# Patient Record
Sex: Female | Born: 1955 | Race: White | Hispanic: No | Smoking: Never smoker
Health system: Southern US, Community
[De-identification: ages and names within clinical notes are randomized; demographics above are authoritative.]

## PROBLEM LIST (undated history)

## (undated) DIAGNOSIS — I10 Essential (primary) hypertension: Secondary | ICD-10-CM

## (undated) HISTORY — PX: BACK SURGERY: SHX140

## (undated) HISTORY — PX: EYE SURGERY: SHX253

## (undated) HISTORY — PX: COLONOSCOPY: SHX174

---

## 2008-09-24 ENCOUNTER — Ambulatory Visit: Payer: Self-pay | Admitting: Orthopedic Surgery

## 2014-03-21 ENCOUNTER — Ambulatory Visit: Payer: Self-pay | Admitting: Gastroenterology

## 2015-04-21 ENCOUNTER — Encounter: Payer: Self-pay | Admitting: Emergency Medicine

## 2015-04-21 ENCOUNTER — Emergency Department: Payer: No Typology Code available for payment source

## 2015-04-21 ENCOUNTER — Emergency Department
Admission: EM | Admit: 2015-04-21 | Discharge: 2015-04-21 | Disposition: A | Discharge status: Home / Self Care | Payer: No Typology Code available for payment source | Attending: Emergency Medicine | Admitting: Emergency Medicine

## 2015-04-21 DIAGNOSIS — S3992XA Unspecified injury of lower back, initial encounter: Secondary | ICD-10-CM | POA: Insufficient documentation

## 2015-04-21 DIAGNOSIS — Y998 Other external cause status: Secondary | ICD-10-CM | POA: Diagnosis not present

## 2015-04-21 DIAGNOSIS — Y9289 Other specified places as the place of occurrence of the external cause: Secondary | ICD-10-CM | POA: Insufficient documentation

## 2015-04-21 DIAGNOSIS — S20212A Contusion of left front wall of thorax, initial encounter: Secondary | ICD-10-CM | POA: Diagnosis not present

## 2015-04-21 DIAGNOSIS — S199XXA Unspecified injury of neck, initial encounter: Secondary | ICD-10-CM | POA: Diagnosis present

## 2015-04-21 DIAGNOSIS — Y9389 Activity, other specified: Secondary | ICD-10-CM | POA: Insufficient documentation

## 2015-04-21 DIAGNOSIS — Y92410 Unspecified street and highway as the place of occurrence of the external cause: Secondary | ICD-10-CM | POA: Insufficient documentation

## 2015-04-21 DIAGNOSIS — S161XXA Strain of muscle, fascia and tendon at neck level, initial encounter: Secondary | ICD-10-CM | POA: Diagnosis not present

## 2015-04-21 DIAGNOSIS — I10 Essential (primary) hypertension: Secondary | ICD-10-CM | POA: Insufficient documentation

## 2015-04-21 DIAGNOSIS — M549 Dorsalgia, unspecified: Secondary | ICD-10-CM

## 2015-04-21 HISTORY — DX: Essential (primary) hypertension: I10

## 2015-04-21 MED ORDER — HYDROCODONE-ACETAMINOPHEN 5-325 MG PO TABS
1.0000 | ORAL_TABLET | Freq: Once | ORAL | Status: AC
  Administered 2015-04-21: 1 via ORAL
  Filled 2015-04-21: qty 1

## 2015-04-21 MED ORDER — IBUPROFEN 600 MG PO TABS: 600.0000 mg | ORAL_TABLET | Freq: Once | ORAL | Status: AC

## 2015-04-21 MED ORDER — HYDROCODONE-ACETAMINOPHEN 5-325 MG PO TABS: 1.0000 | ORAL_TABLET | Freq: Once | ORAL | Status: AC

## 2015-04-21 MED ORDER — IBUPROFEN 600 MG PO TABS
600.0000 mg | ORAL_TABLET | Freq: Once | ORAL | Status: AC
  Administered 2015-04-21: 600 mg via ORAL
  Filled 2015-04-21: qty 1
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
## 2019-09-06 ENCOUNTER — Ambulatory Visit: Payer: BC Managed Care – PPO | Attending: Internal Medicine

## 2019-09-06 DIAGNOSIS — Z20822 Contact with and (suspected) exposure to covid-19: Secondary | ICD-10-CM

## 2019-09-07 LAB — NOVEL CORONAVIRUS, NAA: SARS-CoV-2, NAA: NOT DETECTED

## 2019-11-20 ENCOUNTER — Other Ambulatory Visit: Payer: Self-pay

## 2019-11-20 ENCOUNTER — Telehealth: Payer: Self-pay

## 2019-11-20 DIAGNOSIS — Z86010 Personal history of colonic polyps: Secondary | ICD-10-CM

## 2019-11-20 NOTE — Telephone Encounter (Signed)
Gastroenterology Pre-Procedure Review  Request Date: Tuesday 12/11/19 Requesting Physician: Dr. Vicente Males  PATIENT REVIEW QUESTIONS: The patient responded to the following health history questions as indicated:    1. Are you having any GI issues? no 2. Do you have a personal history of Polyps? no 3. Do you have a family history of Colon Cancer or Polyps? no 4. Diabetes Mellitus? no 5. Joint replacements in the past 12 months?no 6. Major health problems in the past 3 months?no 7. Any artificial heart valves, MVP, or defibrillator?no    MEDICATIONS & ALLERGIES:    Patient reports the following regarding taking any anticoagulation/antiplatelet therapy:   Plavix, Coumadin, Eliquis, Xarelto, Lovenox, Pradaxa, Brilinta, or Effient? no Aspirin? no  Patient confirms/reports the following medications:  Current Outpatient Medications  Medication Sig Dispense Refill  . HYDROcodone-acetaminophen (NORCO/VICODIN) 5-325 MG per tablet Take 1 tablet by mouth once. 30 tablet 0  . ibuprofen (ADVIL,MOTRIN) 600 MG tablet Take 1 tablet (600 mg total) by mouth once. 30 tablet 0   No current facility-administered medications for this visit.    Patient confirms/reports the following allergies:  No Known Allergies  No orders of the defined types were placed in this encounter.   AUTHORIZATION INFORMATION Primary Insurance: 1D#: Group #:  Secondary Insurance: 1D#: Group #:  SCHEDULE INFORMATION: Date: Tuesday 12/11/19 Time: Location:[facility]

## 2019-11-30 ENCOUNTER — Ambulatory Visit: Payer: BC Managed Care – PPO | Attending: Internal Medicine

## 2019-11-30 DIAGNOSIS — Z23 Encounter for immunization: Secondary | ICD-10-CM

## 2019-11-30 NOTE — Progress Notes (Signed)
   Covid-19 Vaccination Clinic  Name:  Brooke Diaz    MRN: [id number] DOB: August 21, 1956  11/30/2019  Brooke Diaz was observed post Covid-19 immunization for 15 minutes without incident. She was provided with Vaccine Information Sheet and instruction to access the V-Safe system.   Brooke Diaz was instructed to call 911 with any severe reactions post vaccine: [name] Kitchen Difficulty breathing  . Swelling of face and throat  . A fast heartbeat  . A bad rash all over body  . Dizziness and weakness   Immunizations Administered    Name Date Dose VIS Date Route   Pfizer COVID-19 Vaccine 11/30/2019 10:07 AM 0.3 mL 08/24/2019 Intramuscular   Manufacturer: [place]   Lot: [id number]   [place]: [id number]

## 2019-12-07 ENCOUNTER — Other Ambulatory Visit
Admission: RE | Admit: 2019-12-07 | Discharge: 2019-12-07 | Disposition: A | Discharge status: Home / Self Care | Payer: BC Managed Care – PPO | Source: Ambulatory Visit | Attending: Gastroenterology | Admitting: Gastroenterology

## 2019-12-07 ENCOUNTER — Other Ambulatory Visit: Payer: Self-pay

## 2019-12-07 DIAGNOSIS — Z01812 Encounter for preprocedural laboratory examination: Secondary | ICD-10-CM | POA: Diagnosis present

## 2019-12-07 DIAGNOSIS — Z20822 Contact with and (suspected) exposure to covid-19: Secondary | ICD-10-CM | POA: Insufficient documentation

## 2019-12-07 LAB — SARS CORONAVIRUS 2 (TAT 6-24 HRS): SARS Coronavirus 2: NEGATIVE

## 2019-12-10 ENCOUNTER — Encounter: Payer: Self-pay | Admitting: Gastroenterology

## 2019-12-11 ENCOUNTER — Ambulatory Visit
Admission: RE | Admit: 2019-12-11 | Discharge: 2019-12-11 | Disposition: A | Discharge status: Home / Self Care | Payer: BC Managed Care – PPO | Attending: Gastroenterology | Admitting: Gastroenterology

## 2019-12-11 ENCOUNTER — Ambulatory Visit: Payer: BC Managed Care – PPO | Admitting: Certified Registered"

## 2019-12-11 ENCOUNTER — Encounter: Payer: Self-pay | Admitting: Gastroenterology

## 2019-12-11 ENCOUNTER — Other Ambulatory Visit: Payer: Self-pay

## 2019-12-11 ENCOUNTER — Encounter
Admission: RE | Disposition: A | Discharge status: Home / Self Care | Payer: Self-pay | Source: Home / Self Care | Attending: Gastroenterology

## 2019-12-11 DIAGNOSIS — Z7982 Long term (current) use of aspirin: Secondary | ICD-10-CM | POA: Diagnosis not present

## 2019-12-11 DIAGNOSIS — K635 Polyp of colon: Secondary | ICD-10-CM

## 2019-12-11 DIAGNOSIS — Z791 Long term (current) use of non-steroidal anti-inflammatories (NSAID): Secondary | ICD-10-CM | POA: Insufficient documentation

## 2019-12-11 DIAGNOSIS — Z1211 Encounter for screening for malignant neoplasm of colon: Secondary | ICD-10-CM | POA: Diagnosis present

## 2019-12-11 DIAGNOSIS — D124 Benign neoplasm of descending colon: Secondary | ICD-10-CM | POA: Diagnosis not present

## 2019-12-11 DIAGNOSIS — Z860100 Personal history of colon polyps, unspecified: Secondary | ICD-10-CM

## 2019-12-11 DIAGNOSIS — K640 First degree hemorrhoids: Secondary | ICD-10-CM | POA: Diagnosis not present

## 2019-12-11 DIAGNOSIS — I10 Essential (primary) hypertension: Secondary | ICD-10-CM | POA: Diagnosis not present

## 2019-12-11 DIAGNOSIS — Z79899 Other long term (current) drug therapy: Secondary | ICD-10-CM | POA: Diagnosis not present

## 2019-12-11 HISTORY — PX: COLONOSCOPY WITH PROPOFOL: SHX5780

## 2019-12-11 SURGERY — COLONOSCOPY WITH PROPOFOL
Anesthesia: General

## 2019-12-11 MED ORDER — SODIUM CHLORIDE 0.9 % IV SOLN: INTRAVENOUS | Status: DC

## 2019-12-11 MED ORDER — LIDOCAINE HCL (CARDIAC) PF 100 MG/5ML IV SOSY
PREFILLED_SYRINGE | INTRAVENOUS | Status: DC | PRN
  Administered 2019-12-11: 50 mg via INTRAVENOUS

## 2019-12-11 MED ORDER — PROPOFOL 500 MG/50ML IV EMUL
INTRAVENOUS | Status: DC | PRN
  Administered 2019-12-11: 100 ug/kg/min via INTRAVENOUS

## 2019-12-11 MED ORDER — SODIUM CHLORIDE 0.9 % IV SOLN: INTRAVENOUS | Status: DC | PRN

## 2019-12-11 MED ORDER — PROPOFOL 500 MG/50ML IV EMUL
INTRAVENOUS | Status: AC
  Filled 2019-12-11: qty 50

## 2019-12-11 MED ORDER — PROPOFOL 10 MG/ML IV BOLUS
INTRAVENOUS | Status: DC | PRN
  Administered 2019-12-11: 50 mg via INTRAVENOUS
  Administered 2019-12-11: 20 mg via INTRAVENOUS

## 2019-12-11 NOTE — Anesthesia Preprocedure Evaluation (Signed)
Anesthesia Evaluation  Patient identified by MRN, date of birth, ID band Patient awake    Reviewed: Allergy & Precautions, H&P , NPO status , Patient's Chart, lab work & pertinent test results  Airway Mallampati: II  TM Distance: >3 FB     Dental  (+) Teeth Intact   Pulmonary neg pulmonary ROS,    breath sounds clear to auscultation       Cardiovascular hypertension,  Rhythm:regular Rate:Normal     Neuro/Psych negative neurological ROS  negative psych ROS   GI/Hepatic negative GI ROS, Neg liver ROS,   Endo/Other  negative endocrine ROS  Renal/GU negative Renal ROS  negative genitourinary   Musculoskeletal   Abdominal   Peds  Hematology negative hematology ROS (+)   Anesthesia Other Findings Past Medical History: No date: Hypertension  Past Surgical History: No date: BACK SURGERY No date: COLONOSCOPY No date: EYE SURGERY  BMI    Body Mass Index: 19.75 kg/m      Reproductive/Obstetrics negative OB ROS                             Anesthesia Physical Anesthesia Plan  ASA: II  Anesthesia Plan: General   Post-op Pain Management:    Induction:   PONV Risk Score and Plan: Propofol infusion and TIVA  Airway Management Planned: Natural Airway and Nasal Cannula  Additional Equipment:   Intra-op Plan:   Post-operative Plan:   Informed Consent: I have reviewed the patients History and Physical, chart, labs and discussed the procedure including the risks, benefits and alternatives for the proposed anesthesia with the patient or authorized representative who has indicated his/her understanding and acceptance.     Dental Advisory Given  Plan Discussed with: Anesthesiologist, CRNA and Surgeon  Anesthesia Plan Comments:         Anesthesia Quick Evaluation

## 2019-12-11 NOTE — Anesthesia Postprocedure Evaluation (Signed)
Anesthesia Post Note  Patient: Brooke Diaz  Procedure(s) Performed: COLONOSCOPY WITH PROPOFOL (N/A )  Patient location during evaluation: PACU Anesthesia Type: General Level of consciousness: awake and alert Pain management: pain level controlled Vital Signs Assessment: post-procedure vital signs reviewed and stable Respiratory status: spontaneous breathing, nonlabored ventilation and respiratory function stable Cardiovascular status: blood pressure returned to baseline and stable Postop Assessment: no apparent nausea or vomiting Anesthetic complications: no     Last Vitals:  Vitals:   12/11/19 1104 12/11/19 1114  BP: 113/72 (!) 120/53  Pulse: 62   Resp: 12   Temp: 36.7 C   SpO2: 100%     Last Pain:  Vitals:   12/11/19 1124  TempSrc:   PainSc: 0-No pain                 Tera Mater

## 2019-12-11 NOTE — H&P (Signed)
Brooke Lame, MD [address] [place], [place] [address] Phone:[phone] Fax : [phone]  Primary Care Physician:  Brooke Billet, MD Primary Gastroenterologist:  Dr. Allen Diaz  Pre-Procedure History & Physical: HPI:  Brooke Diaz is a 64 y.o. female is here for an colonoscopy.   Past Medical History:  Diagnosis Date  . Hypertension     Past Surgical History:  Procedure Laterality Date  . BACK SURGERY    . COLONOSCOPY    . EYE SURGERY      Prior to Admission medications   Medication Sig Start Date End Date Taking? Authorizing Provider  aspirin 325 MG EC tablet Take 325 mg by mouth daily.   Yes [provider]  lisinopril (ZESTRIL) 20 MG tablet Take 20 mg by mouth daily.   Yes [provider]  simvastatin (ZOCOR) 10 MG tablet Take 10 mg by mouth daily.   Yes [provider]  HYDROcodone-acetaminophen (NORCO/VICODIN) 5-325 MG per tablet Take 1 tablet by mouth once. Patient not taking: Reported on 12/11/2019 04/21/15   Brooke Hai, PA-C  ibuprofen (ADVIL,MOTRIN) 600 MG tablet Take 1 tablet (600 mg total) by mouth once. Patient not taking: Reported on 12/11/2019 04/21/15   Brooke Hai, PA-C    Allergies as of 11/20/2019  . (No Known Allergies)    History reviewed. No pertinent family history.  Social History   Socioeconomic History  . Marital status: Divorced    Spouse name: Not on file  . Number of children: Not on file  . Years of education: Not on file  . Highest education level: Not on file  Occupational History  . Not on file  Tobacco Use  . Smoking status: Never Smoker  . Smokeless tobacco: Never Used  Substance and Sexual Activity  . Alcohol use: Yes    Alcohol/week: 4.0 standard drinks    Types: 4 Glasses of wine per week  . Drug use: Never  . Sexual activity: Not on file  Other Topics Concern  . Not on file  Social History Narrative  . Not on file   Social Determinants of Health   Financial  Resource Strain:   . Difficulty of Paying Living Expenses:   Food Insecurity:   . Worried About Charity fundraiser in the Last Year:   . Arboriculturist in the Last Year:   Transportation Needs:   . Film/video editor (Medical):   [name] Kitchen Lack of Transportation (Non-Medical):   Physical Activity:   . Days of Exercise per Week:   . Minutes of Exercise per Session:   Stress:   . Feeling of Stress :   Social Connections:   . Frequency of Communication with Friends and Family:   . Frequency of Social Gatherings with Friends and Family:   . Attends Religious Services:   . Active Member of Clubs or Organizations:   . Attends Archivist Meetings:   [name] Kitchen Marital Status:   Intimate Partner Violence:   . Fear of Current or Ex-Partner:   . Emotionally Abused:   [name] Kitchen Physically Abused:   . Sexually Abused:     Review of Systems: See HPI, otherwise negative ROS  Physical Exam: BP (!) 155/70   Pulse 64   Temp (!) 97.5 F (36.4 C) (Temporal)   Resp 16   Ht 5\' 2"  (1.575 m)   Wt 49 kg   SpO2 100%   BMI 19.75 kg/m  General:   Alert,  pleasant and cooperative in NAD Head:  Normocephalic and atraumatic. Neck:  Supple; no masses or thyromegaly. Lungs:  Clear throughout to auscultation.    Heart:  Regular rate and rhythm. Abdomen:  Soft, nontender and nondistended. Normal bowel sounds, without guarding, and without rebound.   Neurologic:  Alert and  oriented x4;  grossly normal neurologically.  Impression/Plan: Brooke Diaz is here for an colonoscopy to be performed for history of colon polyps that were adenomatous in 03/2014  Risks, benefits, limitations, and alternatives regarding  colonoscopy have been reviewed with the patient.  Questions have been answered.  All parties agreeable.   Brooke Lame, MD  12/11/2019, 10:19 AM

## 2019-12-11 NOTE — Transfer of Care (Signed)
Immediate Anesthesia Transfer of Care Note  Patient: Brooke Diaz  Procedure(s) Performed: COLONOSCOPY WITH PROPOFOL (N/A )  Patient Location: Endoscopy Unit  Anesthesia Type:General  Level of Consciousness: awake, alert  and oriented  Airway & Oxygen Therapy: Patient Spontanous Breathing  Post-op Assessment: Report given to RN and Post -op Vital signs reviewed and stable  Post vital signs: Reviewed and stable  Last Vitals:  Vitals Value Taken Time  BP 113/72 12/11/19 1106  Temp 36.7 C 12/11/19 1104  Pulse 63 12/11/19 1106  Resp 15 12/11/19 1106  SpO2 98 % 12/11/19 1106  Vitals shown include unvalidated device data.  Last Pain:  Vitals:   12/11/19 1104  TempSrc: Temporal  PainSc: 0-No pain         Complications: No apparent anesthesia complications

## 2019-12-11 NOTE — Op Note (Signed)
[facility] Gastroenterology Patient Name: Brooke Diaz Procedure Date: 12/11/2019 10:21 AM MRN: [id number] Account #: [id number] Date of Birth: [birth year] Admit Type: Outpatient Age: 64 Room: [facility] ENDO ROOM 4 Gender: Female Note Status: Finalized Procedure:             Colonoscopy Indications:           High risk colon cancer surveillance: Personal history                         of colonic polyps Providers:             Lucilla Lame MD, MD Medicines:             Propofol per Anesthesia Complications:         No immediate complications. Procedure:             Pre-Anesthesia Assessment:                        - Prior to the procedure, a History and Physical was                         performed, and patient medications and allergies were                         reviewed. The patient's tolerance of previous                         anesthesia was also reviewed. The risks and benefits                         of the procedure and the sedation options and risks                         were discussed with the patient. All questions were                         answered, and informed consent was obtained. Prior                         Anticoagulants: The patient has taken no previous                         anticoagulant or antiplatelet agents. ASA Grade                         Assessment: II - A patient with mild systemic disease.                         After reviewing the risks and benefits, the patient                         was deemed in satisfactory condition to undergo the                         procedure.                        After obtaining informed consent, the colonoscope was  passed under direct vision. Throughout the procedure,                         the patient's blood pressure, pulse, and oxygen                         saturations were monitored continuously. The                         Colonoscope was introduced through the anus  and                         advanced to the the cecum, identified by appendiceal                         orifice and ileocecal valve. The colonoscopy was                         performed without difficulty. The patient tolerated                         the procedure well. The quality of the bowel                         preparation was excellent. Findings:      The perianal and digital rectal examinations were normal.      A 4 mm polyp was found in the descending colon. The polyp was sessile.       The polyp was removed with a cold snare. Resection and retrieval were       complete.      Non-bleeding internal hemorrhoids were found during retroflexion. The       hemorrhoids were Grade I (internal hemorrhoids that do not prolapse). Impression:            - One 4 mm polyp in the descending colon, removed with                         a cold snare. Resected and retrieved.                        - Non-bleeding internal hemorrhoids. Recommendation:        - Discharge patient to home.                        - Resume previous diet.                        - Continue present medications.                        - Await pathology results.                        - Repeat colonoscopy in 5 years for surveillance. Procedure Code(s):     --- Professional ---                        [phone], Colonoscopy, flexible; with removal of  tumor(s), polyp(s), or other lesion(s) by snare                         technique Diagnosis Code(s):     --- Professional ---                        Z86.010, Personal history of colonic polyps                        K63.5, Polyp of colon CPT copyright 2019 American Medical Association. All rights reserved. The codes documented in this report are preliminary and upon coder review may  be revised to meet current compliance requirements. Lucilla Lame MD, MD 12/11/2019 11:01:40 AM This report has been signed electronically. Number of Addenda: 0 Note  Initiated On: 12/11/2019 10:21 AM Scope Withdrawal Time: 0 hours 10 minutes 51 seconds  Total Procedure Duration: 0 hours 21 minutes 0 seconds  Estimated Blood Loss:  Estimated blood loss: none.      [facility]

## 2019-12-12 ENCOUNTER — Encounter: Payer: Self-pay | Admitting: Gastroenterology

## 2019-12-12 ENCOUNTER — Encounter: Payer: Self-pay | Admitting: *Deleted

## 2019-12-12 LAB — SURGICAL PATHOLOGY

## 2019-12-18 ENCOUNTER — Telehealth: Payer: Self-pay

## 2019-12-18 ENCOUNTER — Other Ambulatory Visit: Payer: Self-pay

## 2019-12-18 NOTE — Telephone Encounter (Signed)
-----   Message from Lucilla Lame, MD sent at 12/12/2019 10:08 PM EDT ----- Let the patient know the polyp was not seen on pathology bt does not change the need for a repeat colonoscopy in 5 years due to the history.

## 2019-12-18 NOTE — Telephone Encounter (Signed)
Pt notified of results

## 2019-12-25 ENCOUNTER — Ambulatory Visit: Payer: BC Managed Care – PPO | Attending: Internal Medicine

## 2019-12-25 DIAGNOSIS — Z23 Encounter for immunization: Secondary | ICD-10-CM

## 2019-12-25 NOTE — Progress Notes (Signed)
   Covid-19 Vaccination Clinic  Name:  Brooke Diaz    MRN: [id number] DOB: March 11, 1956  12/25/2019  [name] was observed post Covid-19 immunization for 15 minutes without incident. She was provided with Vaccine Information Sheet and instruction to access the V-Safe system.   [name] was instructed to call 911 with any severe reactions post vaccine: [name] Kitchen Difficulty breathing  . Swelling of face and throat  . A fast heartbeat  . A bad rash all over body  . Dizziness and weakness   Immunizations Administered    Name Date Dose VIS Date Route   Pfizer COVID-19 Vaccine 12/25/2019  2:51 PM 0.3 mL 08/24/2019 Intramuscular   Manufacturer: [place]   Lot: [id number]   [place]: [id number]
